# Patient Record
Sex: Female | Born: 1970 | Race: Black or African American | Hispanic: No | Marital: Single | Smoking: Never smoker
Health system: Southern US, Community
[De-identification: ages and names within clinical notes are randomized; demographics above are authoritative.]

## PROBLEM LIST (undated history)

## (undated) DIAGNOSIS — E079 Disorder of thyroid, unspecified: Secondary | ICD-10-CM

## (undated) DIAGNOSIS — I10 Essential (primary) hypertension: Secondary | ICD-10-CM

## (undated) DIAGNOSIS — E119 Type 2 diabetes mellitus without complications: Secondary | ICD-10-CM

## (undated) DIAGNOSIS — D259 Leiomyoma of uterus, unspecified: Secondary | ICD-10-CM

## (undated) DIAGNOSIS — E785 Hyperlipidemia, unspecified: Secondary | ICD-10-CM

---

## 2020-09-28 ENCOUNTER — Emergency Department (HOSPITAL_COMMUNITY): Payer: BC Managed Care – PPO

## 2020-09-28 ENCOUNTER — Observation Stay (HOSPITAL_COMMUNITY)
Admission: EM | Admit: 2020-09-28 | Discharge: 2020-09-30 | Disposition: A | Discharge status: Home / Self Care | Payer: BC Managed Care – PPO | Attending: Surgery | Admitting: Surgery

## 2020-09-28 ENCOUNTER — Other Ambulatory Visit: Payer: Self-pay

## 2020-09-28 DIAGNOSIS — S80211A Abrasion, right knee, initial encounter: Secondary | ICD-10-CM | POA: Insufficient documentation

## 2020-09-28 DIAGNOSIS — S29011A Strain of muscle and tendon of front wall of thorax, initial encounter: Secondary | ICD-10-CM

## 2020-09-28 DIAGNOSIS — S2243XA Multiple fractures of ribs, bilateral, initial encounter for closed fracture: Principal | ICD-10-CM | POA: Diagnosis present

## 2020-09-28 DIAGNOSIS — S2231XA Fracture of one rib, right side, initial encounter for closed fracture: Secondary | ICD-10-CM | POA: Insufficient documentation

## 2020-09-28 DIAGNOSIS — E119 Type 2 diabetes mellitus without complications: Secondary | ICD-10-CM | POA: Insufficient documentation

## 2020-09-28 DIAGNOSIS — R58 Hemorrhage, not elsewhere classified: Secondary | ICD-10-CM | POA: Diagnosis present

## 2020-09-28 DIAGNOSIS — I10 Essential (primary) hypertension: Secondary | ICD-10-CM | POA: Diagnosis not present

## 2020-09-28 DIAGNOSIS — R519 Headache, unspecified: Secondary | ICD-10-CM | POA: Diagnosis not present

## 2020-09-28 DIAGNOSIS — Z91040 Latex allergy status: Secondary | ICD-10-CM | POA: Insufficient documentation

## 2020-09-28 DIAGNOSIS — S80212A Abrasion, left knee, initial encounter: Secondary | ICD-10-CM | POA: Diagnosis not present

## 2020-09-28 DIAGNOSIS — Y92410 Unspecified street and highway as the place of occurrence of the external cause: Secondary | ICD-10-CM | POA: Insufficient documentation

## 2020-09-28 DIAGNOSIS — S299XXA Unspecified injury of thorax, initial encounter: Secondary | ICD-10-CM | POA: Diagnosis present

## 2020-09-28 DIAGNOSIS — Z23 Encounter for immunization: Secondary | ICD-10-CM | POA: Insufficient documentation

## 2020-09-28 DIAGNOSIS — T148XXA Other injury of unspecified body region, initial encounter: Secondary | ICD-10-CM

## 2020-09-28 DIAGNOSIS — E785 Hyperlipidemia, unspecified: Secondary | ICD-10-CM | POA: Diagnosis present

## 2020-09-28 DIAGNOSIS — S2242XA Multiple fractures of ribs, left side, initial encounter for closed fracture: Secondary | ICD-10-CM | POA: Diagnosis not present

## 2020-09-28 DIAGNOSIS — S2221XA Fracture of manubrium, initial encounter for closed fracture: Principal | ICD-10-CM | POA: Insufficient documentation

## 2020-09-28 DIAGNOSIS — S301XXA Contusion of abdominal wall, initial encounter: Secondary | ICD-10-CM | POA: Diagnosis not present

## 2020-09-28 DIAGNOSIS — Z20822 Contact with and (suspected) exposure to covid-19: Secondary | ICD-10-CM | POA: Insufficient documentation

## 2020-09-28 DIAGNOSIS — E079 Disorder of thyroid, unspecified: Secondary | ICD-10-CM | POA: Diagnosis present

## 2020-09-28 DIAGNOSIS — S2249XA Multiple fractures of ribs, unspecified side, initial encounter for closed fracture: Secondary | ICD-10-CM

## 2020-09-28 HISTORY — DX: Type 2 diabetes mellitus without complications: E11.9

## 2020-09-28 HISTORY — DX: Essential (primary) hypertension: I10

## 2020-09-28 HISTORY — DX: Leiomyoma of uterus, unspecified: D25.9

## 2020-09-28 HISTORY — DX: Disorder of thyroid, unspecified: E07.9

## 2020-09-28 HISTORY — DX: Hyperlipidemia, unspecified: E78.5

## 2020-09-28 LAB — CBC
HCT: 32.9 % — ABNORMAL LOW (ref 36.0–46.0)
Hemoglobin: 10.7 g/dL — ABNORMAL LOW (ref 12.0–15.0)
MCH: 29.9 pg (ref 26.0–34.0)
MCHC: 32.5 g/dL (ref 30.0–36.0)
MCV: 91.9 fL (ref 80.0–100.0)
Platelets: 247 10*3/uL (ref 150–400)
RBC: 3.58 MIL/uL — ABNORMAL LOW (ref 3.87–5.11)
RDW: 12.8 % (ref 11.5–15.5)
WBC: 12.7 10*3/uL — ABNORMAL HIGH (ref 4.0–10.5)
nRBC: 0 % (ref 0.0–0.2)

## 2020-09-28 LAB — COMPREHENSIVE METABOLIC PANEL
ALT: 12 U/L (ref 0–44)
AST: 21 U/L (ref 15–41)
Albumin: 3.3 g/dL — ABNORMAL LOW (ref 3.5–5.0)
Alkaline Phosphatase: 39 U/L (ref 38–126)
Anion gap: 9 (ref 5–15)
BUN: 7 mg/dL (ref 6–20)
CO2: 19 mmol/L — ABNORMAL LOW (ref 22–32)
Calcium: 7.5 mg/dL — ABNORMAL LOW (ref 8.9–10.3)
Chloride: 105 mmol/L (ref 98–111)
Creatinine, Ser: 0.56 mg/dL (ref 0.44–1.00)
GFR, Estimated: >60 mL/min (ref >60)
Glucose, Bld: 235 mg/dL — ABNORMAL HIGH (ref 70–99)
Potassium: 2.9 mmol/L — ABNORMAL LOW (ref 3.5–5.1)
Sodium: 133 mmol/L — ABNORMAL LOW (ref 135–145)
Total Bilirubin: 0.5 mg/dL (ref 0.3–1.2)
Total Protein: 5.6 g/dL — ABNORMAL LOW (ref 6.5–8.1)

## 2020-09-28 LAB — I-STAT CHEM 8, ED
BUN: 8 mg/dL (ref 6–20)
Calcium, Ion: 1.09 mmol/L — ABNORMAL LOW (ref 1.15–1.40)
Chloride: 101 mmol/L (ref 98–111)
Creatinine, Ser: 0.5 mg/dL (ref 0.44–1.00)
Glucose, Bld: 225 mg/dL — ABNORMAL HIGH (ref 70–99)
HCT: 34 % — ABNORMAL LOW (ref 36.0–46.0)
Hemoglobin: 11.6 g/dL — ABNORMAL LOW (ref 12.0–15.0)
Potassium: 3.4 mmol/L — ABNORMAL LOW (ref 3.5–5.1)
Sodium: 139 mmol/L (ref 135–145)
TCO2: 25 mmol/L (ref 22–32)

## 2020-09-28 LAB — I-STAT BETA HCG BLOOD, ED (MC, WL, AP ONLY): I-stat hCG, quantitative: <5 m[IU]/mL (ref <5)

## 2020-09-28 MED ORDER — FENTANYL CITRATE (PF) 100 MCG/2ML IJ SOLN
50.0000 ug | Freq: Once | INTRAMUSCULAR | Status: AC
Start: 2020-09-28 — End: 2020-09-28
  Administered 2020-09-28: 50 ug via INTRAVENOUS
  Filled 2020-09-28: qty 2

## 2020-09-28 MED ORDER — IOHEXOL 350 MG/ML SOLN
100.0000 mL | Freq: Once | INTRAVENOUS | Status: AC | PRN
  Administered 2020-09-28: 100 mL via INTRAVENOUS

## 2020-09-28 MED ORDER — TETANUS-DIPHTH-ACELL PERTUSSIS 5-2.5-18.5 LF-MCG/0.5 IM SUSY
0.5000 mL | PREFILLED_SYRINGE | Freq: Once | INTRAMUSCULAR | Status: AC
  Administered 2020-09-28: 0.5 mL via INTRAMUSCULAR
  Filled 2020-09-28: qty 0.5
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
## 2020-09-28 NOTE — Progress Notes (Signed)
Orthopedic Tech Progress Note Patient Details:  Paula Clarke May 21, 1971 [id number] Level 2 Trauma  Patient ID: Paula Clarke, female   DOB: 1970-12-19, 50 y.o.   MRN: [id number]   Jearld Lesch 09/28/2020, 10:22 PM
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
## 2020-09-29 ENCOUNTER — Emergency Department (HOSPITAL_COMMUNITY): Payer: BC Managed Care – PPO

## 2020-09-29 ENCOUNTER — Encounter (HOSPITAL_COMMUNITY): Payer: Self-pay | Admitting: Physician Assistant

## 2020-09-29 DIAGNOSIS — S2221XA Fracture of manubrium, initial encounter for closed fracture: Secondary | ICD-10-CM | POA: Diagnosis not present

## 2020-09-29 LAB — BASIC METABOLIC PANEL
Anion gap: 8 (ref 5–15)
BUN: 5 mg/dL — ABNORMAL LOW (ref 6–20)
CO2: 25 mmol/L (ref 22–32)
Calcium: 8.4 mg/dL — ABNORMAL LOW (ref 8.9–10.3)
Chloride: 101 mmol/L (ref 98–111)
Creatinine, Ser: 0.59 mg/dL (ref 0.44–1.00)
GFR, Estimated: >60 mL/min (ref >60)
Glucose, Bld: 190 mg/dL — ABNORMAL HIGH (ref 70–99)
Potassium: 3.5 mmol/L (ref 3.5–5.1)
Sodium: 134 mmol/L — ABNORMAL LOW (ref 135–145)

## 2020-09-29 LAB — URINALYSIS, ROUTINE W REFLEX MICROSCOPIC
Bilirubin Urine: NEGATIVE
Glucose, UA: 50 mg/dL — AB
Ketones, ur: 5 mg/dL — AB
Leukocytes,Ua: NEGATIVE
Nitrite: NEGATIVE
Protein, ur: NEGATIVE mg/dL
Specific Gravity, Urine: 1.011 (ref 1.005–1.030)
pH: 5 (ref 5.0–8.0)

## 2020-09-29 LAB — CBC
HCT: 26.8 % — ABNORMAL LOW (ref 36.0–46.0)
Hemoglobin: 9.2 g/dL — ABNORMAL LOW (ref 12.0–15.0)
MCH: 30 pg (ref 26.0–34.0)
MCHC: 34.3 g/dL (ref 30.0–36.0)
MCV: 87.3 fL (ref 80.0–100.0)
Platelets: 240 10*3/uL (ref 150–400)
RBC: 3.07 MIL/uL — ABNORMAL LOW (ref 3.87–5.11)
RDW: 13 % (ref 11.5–15.5)
WBC: 6.2 10*3/uL (ref 4.0–10.5)
nRBC: 0 % (ref 0.0–0.2)

## 2020-09-29 LAB — GLUCOSE, CAPILLARY
Glucose-Capillary: 215 mg/dL — ABNORMAL HIGH (ref 70–99)
Glucose-Capillary: 169 mg/dL — ABNORMAL HIGH (ref 70–99)
Glucose-Capillary: 187 mg/dL — ABNORMAL HIGH (ref 70–99)
Glucose-Capillary: 194 mg/dL — ABNORMAL HIGH (ref 70–99)
Glucose-Capillary: 199 mg/dL — ABNORMAL HIGH (ref 70–99)

## 2020-09-29 LAB — TROPONIN I (HIGH SENSITIVITY)
Troponin I (High Sensitivity): 3 ng/L (ref <18)
Troponin I (High Sensitivity): 3 ng/L (ref <18)

## 2020-09-29 LAB — MRSA PCR SCREENING: MRSA by PCR: NEGATIVE

## 2020-09-29 LAB — HEMOGLOBIN A1C
Hgb A1c MFr Bld: 7.2 % — ABNORMAL HIGH (ref 4.8–5.6)
Mean Plasma Glucose: 159.94 mg/dL

## 2020-09-29 LAB — HIV ANTIBODY (ROUTINE TESTING W REFLEX): HIV Screen 4th Generation wRfx: NONREACTIVE

## 2020-09-29 LAB — SAMPLE TO BLOOD BANK

## 2020-09-29 LAB — RESP PANEL BY RT-PCR (FLU A&B, COVID) ARPGX2
Influenza A by PCR: NEGATIVE
Influenza B by PCR: NEGATIVE
SARS Coronavirus 2 by RT PCR: NEGATIVE

## 2020-09-29 LAB — ETHANOL: Alcohol, Ethyl (B): <10 mg/dL (ref <10)

## 2020-09-29 MED ORDER — INSULIN ASPART 100 UNIT/ML ~~LOC~~ SOLN
0.0000 [IU] | Freq: Three times a day (TID) | SUBCUTANEOUS | Status: DC
  Administered 2020-09-29: 2 [IU] via SUBCUTANEOUS
  Administered 2020-09-30: 3 [IU] via SUBCUTANEOUS
  Administered 2020-09-30: 2 [IU] via SUBCUTANEOUS

## 2020-09-29 MED ORDER — METHIMAZOLE 10 MG PO TABS
10.0000 mg | ORAL_TABLET | Freq: Every day | ORAL | Status: DC
  Administered 2020-09-29 – 2020-09-30 (×2): 10 mg via ORAL
  Filled 2020-09-29 (×2): qty 1

## 2020-09-29 MED ORDER — ACETAMINOPHEN 325 MG PO TABS: 650.0000 mg | ORAL_TABLET | ORAL | Status: DC | PRN

## 2020-09-29 MED ORDER — OXYCODONE HCL 5 MG PO TABS: 10.0000 mg | ORAL_TABLET | ORAL | Status: DC | PRN

## 2020-09-29 MED ORDER — ROSUVASTATIN CALCIUM 20 MG PO TABS
20.0000 mg | ORAL_TABLET | Freq: Every day | ORAL | Status: DC
  Administered 2020-09-29 – 2020-09-30 (×2): 20 mg via ORAL
  Filled 2020-09-29: qty 1
  Filled 2020-09-29: qty 4

## 2020-09-29 MED ORDER — OXYCODONE HCL 5 MG PO TABS: 5.0000 mg | ORAL_TABLET | ORAL | Status: DC | PRN

## 2020-09-29 MED ORDER — ONDANSETRON HCL 4 MG/2ML IJ SOLN: 4.0000 mg | Freq: Four times a day (QID) | INTRAMUSCULAR | Status: DC | PRN

## 2020-09-29 MED ORDER — ENOXAPARIN SODIUM 30 MG/0.3ML ~~LOC~~ SOLN: 30.0000 mg | Freq: Two times a day (BID) | SUBCUTANEOUS | Status: DC

## 2020-09-29 MED ORDER — MORPHINE SULFATE (PF) 4 MG/ML IV SOLN: 4.0000 mg | INTRAVENOUS | Status: DC | PRN

## 2020-09-29 MED ORDER — GLIMEPIRIDE 2 MG PO TABS
2.0000 mg | ORAL_TABLET | Freq: Every day | ORAL | Status: DC
  Administered 2020-09-29: 2 mg via ORAL
  Filled 2020-09-29 (×2): qty 1

## 2020-09-29 MED ORDER — ONDANSETRON 4 MG PO TBDP: 4.0000 mg | ORAL_TABLET | Freq: Four times a day (QID) | ORAL | Status: DC | PRN

## 2020-09-29 MED ORDER — METFORMIN HCL 500 MG PO TABS
500.0000 mg | ORAL_TABLET | Freq: Two times a day (BID) | ORAL | Status: DC
  Filled 2020-09-29 (×2): qty 1

## 2020-09-29 MED ORDER — LISINOPRIL 10 MG PO TABS
10.0000 mg | ORAL_TABLET | Freq: Every day | ORAL | Status: DC
  Administered 2020-09-29 – 2020-09-30 (×2): 10 mg via ORAL
  Filled 2020-09-29 (×2): qty 1

## 2020-09-29 MED ORDER — METHOCARBAMOL 500 MG PO TABS
1000.0000 mg | ORAL_TABLET | Freq: Three times a day (TID) | ORAL | Status: DC
  Administered 2020-09-29 (×3): 1000 mg via ORAL
  Filled 2020-09-29 (×3): qty 2

## 2020-09-29 MED ORDER — ACETAMINOPHEN 500 MG PO TABS
1000.0000 mg | ORAL_TABLET | Freq: Four times a day (QID) | ORAL | Status: DC
  Administered 2020-09-29 – 2020-09-30 (×3): 1000 mg via ORAL
  Filled 2020-09-29 (×4): qty 2

## 2020-09-29 MED ORDER — MORPHINE SULFATE (PF) 2 MG/ML IV SOLN: 2.0000 mg | INTRAVENOUS | Status: DC | PRN

## 2020-09-29 MED ORDER — FENTANYL CITRATE (PF) 100 MCG/2ML IJ SOLN
50.0000 ug | Freq: Once | INTRAMUSCULAR | Status: AC
Start: 2020-09-29 — End: 2020-09-29
  Administered 2020-09-29: 50 ug via INTRAVENOUS
  Filled 2020-09-29: qty 2

## 2020-09-29 MED ORDER — POTASSIUM CHLORIDE IN NACL 20-0.9 MEQ/L-% IV SOLN
INTRAVENOUS | Status: DC
  Filled 2020-09-29 (×2): qty 1000

## 2020-09-29 MED ORDER — KETOROLAC TROMETHAMINE 30 MG/ML IJ SOLN
30.0000 mg | Freq: Four times a day (QID) | INTRAMUSCULAR | Status: DC
  Administered 2020-09-29 (×2): 30 mg via INTRAVENOUS
  Filled 2020-09-29 (×4): qty 1

## 2020-09-29 NOTE — Progress Notes (Signed)
0800 PT refused insulins, states "shes never taken insulin at home and doesn't wish to start asa long as she takes her metformin and glimepiride in the morning together at the same time   At 1114 pt was able to stand and ambulate to the bathroom and back to the bed without feeling dizzy. Pain is a 1 out of 10. Patient also makes note of preferring mobilizing to bathroom over pure wick because the pt is on her cycle. Shelbie Proctor, RN
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
## 2020-09-29 NOTE — Progress Notes (Signed)
Patient seen and examined. Pain control regimen adjusted. PT/OT, possibly home in AM.   Jesusita Oka, MD General and [facility]

## 2020-09-29 NOTE — Progress Notes (Signed)
Orthopedic Tech Progress Note Patient Details:  Paula Clarke [birth year] [id number]  Ortho Devices Type of Ortho Device: Arm sling Ortho Device/Splint Location: Left Arm Ortho Device/Splint Interventions: Application   Post Interventions Patient Tolerated: Well   [name] [name] 09/29/2020, 2:27 AM
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
## 2020-09-29 NOTE — ED Notes (Signed)
Several staff attempts made to draw lab specimens from pt.  Unable to obtain all orders. Charge nurse made aware of findings. Provider made aware of same.

## 2020-09-29 NOTE — Progress Notes (Signed)
PT Cancellation Note  Patient Details Name: Paula Clarke MRN: [id number] DOB: 1970/10/06   Cancelled Treatment:    Reason Eval/Treat Not Completed: Other (comment) Noted PT eval order with start date of 3/20. PT will follow up tomorrow with evaluation as appropriate. Please feel free to reach out if appropriate for evaluation earlier.   Hardie Pulley, DPT   Acute Rehabilitation Department Pager #: [phone]   Otho Bellows 09/29/2020, 8:49 AM

## 2020-09-29 NOTE — Progress Notes (Signed)
OT NOTE  Order received for 3/20 start date. OT to follow up at later/ date and time. Department number [phone]   Fleeta Emmer, OTR/L  Acute Rehabilitation Services Pager: [phone] Office: [phone] .
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
## 2020-09-30 DIAGNOSIS — S2221XA Fracture of manubrium, initial encounter for closed fracture: Secondary | ICD-10-CM | POA: Diagnosis not present

## 2020-09-30 LAB — GLUCOSE, CAPILLARY
Glucose-Capillary: 142 mg/dL — ABNORMAL HIGH (ref 70–99)
Glucose-Capillary: 207 mg/dL — ABNORMAL HIGH (ref 70–99)

## 2020-09-30 MED ORDER — KETOROLAC TROMETHAMINE 10 MG PO TABS: 10.0000 mg | ORAL_TABLET | Freq: Four times a day (QID) | ORAL | 0 refills | Status: AC

## 2020-09-30 MED ORDER — OXYCODONE HCL 5 MG PO TABS: 5.0000 mg | ORAL_TABLET | ORAL | 0 refills | Status: AC | PRN

## 2020-09-30 MED ORDER — METHOCARBAMOL 500 MG PO TABS: 1000.0000 mg | ORAL_TABLET | Freq: Three times a day (TID) | ORAL | 1 refills | Status: AC

## 2020-09-30 MED ORDER — ACETAMINOPHEN 500 MG PO TABS: 1000.0000 mg | ORAL_TABLET | Freq: Four times a day (QID) | ORAL | 2 refills | Status: AC
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
## 2020-09-30 NOTE — TOC Transition Note (Signed)
Transition of Care [facility]) - CM/SW Discharge Note   Patient Details  Name: Paula Clarke MRN: [id number] Date of Birth: March 28, 1971  Transition of [facility]) CM/SW Contact:  Sharin Mons, RN Phone Number: 09/30/2020, 11:26 AM   Clinical Narrative:    Patient will DC to: Home Anticipated DC date: 09/30/2020 Family notified: yes Transport by: son   Per MD patient ready for DC today. RN, patient, patient's son/ family aware  of DC.  Pt states resides with family ( son/aunt). States family to assist with care if needed once d/c.DME : rolling walker and 3in 1/BSC will be delivered to bedside prior to d/c.  Pt without Rx med concerns or affordability issues.  Post hospital follow up appointment noted on AVS.  RNCM will sign off for now as intervention is no longer needed. Please consult [place] again if new needs arise.    Final next level of care: Home/Self Care Barriers to Discharge: No Barriers Identified   Patient Goals and CMS Choice Patient states their goals for this hospitalization and ongoing recovery are:: "Go back home and try to take care of myself."   Choice offered to / list presented to : Patient  Discharge Placement                       Discharge Plan and Services                DME Arranged: 3-N-1,Cane DME Agency: [employer] Date DME Agency Contacted: 09/30/20 Time DME Agency Contacted: [phone] Representative spoke with at DME Agency: [facility] ([place]) Interventions     Readmission Risk Interventions No flowsheet data found.
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
## 2020-09-30 NOTE — Progress Notes (Signed)
OT NOTE  OT session complete with pending note. Pt is at adequate level to d/c home with 3n1.   Fleeta Emmer, OTR/L  Acute Rehabilitation Services Pager: [phone] Office: [phone] .

## 2020-09-30 NOTE — Plan of Care (Signed)
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
## 2020-09-30 NOTE — Progress Notes (Signed)
Pt given AVS discharge instructions, pt questions answered and verbalized understanding of education given. No printed prescriptions. No further needs at this time. Pt  transferred off unit to car by primary RN and pt son.
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
## 2020-10-22 ENCOUNTER — Telehealth: Payer: Self-pay

## 2020-10-22 ENCOUNTER — Ambulatory Visit: Payer: BC Managed Care – PPO | Admitting: Orthopedic Surgery

## 2020-10-22 DIAGNOSIS — R29898 Other symptoms and signs involving the musculoskeletal system: Secondary | ICD-10-CM

## 2020-10-22 DIAGNOSIS — M25561 Pain in right knee: Secondary | ICD-10-CM

## 2020-10-22 DIAGNOSIS — M25512 Pain in left shoulder: Secondary | ICD-10-CM

## 2020-10-22 DIAGNOSIS — M238X9 Other internal derangements of unspecified knee: Secondary | ICD-10-CM

## 2020-10-22 DIAGNOSIS — S29011A Strain of muscle and tendon of front wall of thorax, initial encounter: Secondary | ICD-10-CM

## 2020-10-22 NOTE — Telephone Encounter (Signed)
Patient was seen in our office today she is requesting the clinical notes to be sent to her pcp , patient also wants her own copy of the same notes to be sent to her via e-mail: [email or web address] pcp fax:[phone]

## 2020-10-22 NOTE — Telephone Encounter (Signed)
Will fax once available.

## 2020-10-24 NOTE — Telephone Encounter (Signed)
Still pending

## 2020-10-29 NOTE — Telephone Encounter (Signed)
Still pending

## 2020-10-31 ENCOUNTER — Encounter: Payer: Self-pay | Admitting: Orthopedic Surgery
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
## 2020-10-31 NOTE — Telephone Encounter (Signed)
Can either of you please dictate so I can send note for patient?

## 2020-10-31 NOTE — Telephone Encounter (Signed)
Yes I will

## 2020-11-05 ENCOUNTER — Ambulatory Visit
Admission: RE | Admit: 2020-11-05 | Discharge: 2020-11-05 | Disposition: A | Discharge status: Home / Self Care | Payer: BC Managed Care – PPO | Source: Ambulatory Visit | Attending: Surgical | Admitting: Surgical

## 2020-11-05 DIAGNOSIS — M25561 Pain in right knee: Secondary | ICD-10-CM

## 2020-11-15 ENCOUNTER — Ambulatory Visit
Admission: RE | Admit: 2020-11-15 | Discharge: 2020-11-15 | Disposition: A | Discharge status: Home / Self Care | Payer: BC Managed Care – PPO | Source: Ambulatory Visit | Attending: Surgical | Admitting: Surgical

## 2020-11-15 ENCOUNTER — Other Ambulatory Visit: Payer: Self-pay

## 2020-11-15 DIAGNOSIS — M25512 Pain in left shoulder: Secondary | ICD-10-CM

## 2020-11-15 MED ORDER — IOPAMIDOL (ISOVUE-M 200) INJECTION 41%
12.0000 mL | Freq: Once | INTRAMUSCULAR | Status: AC
  Administered 2020-11-15: 12 mL via INTRA_ARTICULAR

## 2020-11-19 ENCOUNTER — Encounter: Payer: Self-pay | Admitting: Orthopedic Surgery

## 2020-11-19 ENCOUNTER — Ambulatory Visit (INDEPENDENT_AMBULATORY_CARE_PROVIDER_SITE_OTHER): Payer: BC Managed Care – PPO | Admitting: Orthopedic Surgery

## 2020-11-19 DIAGNOSIS — M25512 Pain in left shoulder: Secondary | ICD-10-CM

## 2020-11-24 ENCOUNTER — Encounter: Payer: Self-pay | Admitting: Orthopedic Surgery
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
